# Patient Record
Sex: Female | Born: 2000 | Race: White | Hispanic: No | Marital: Single | Smoking: Never smoker
Health system: Southern US, Community
[De-identification: ages and names within clinical notes are randomized; demographics above are authoritative.]

## PROBLEM LIST (undated history)

## (undated) DIAGNOSIS — F32A Depression, unspecified: Secondary | ICD-10-CM

## (undated) DIAGNOSIS — F419 Anxiety disorder, unspecified: Secondary | ICD-10-CM

## (undated) DIAGNOSIS — B079 Viral wart, unspecified: Secondary | ICD-10-CM

## (undated) DIAGNOSIS — J45909 Unspecified asthma, uncomplicated: Secondary | ICD-10-CM

## (undated) HISTORY — DX: Unspecified asthma, uncomplicated: J45.909

## (undated) HISTORY — DX: Viral wart, unspecified: B07.9

## (undated) HISTORY — DX: Anxiety disorder, unspecified: F41.9

## (undated) HISTORY — DX: Depression, unspecified: F32.A

---
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
## 2022-03-11 ENCOUNTER — Other Ambulatory Visit: Payer: Self-pay | Admitting: Family Medicine

## 2022-03-11 DIAGNOSIS — Z3009 Encounter for other general counseling and advice on contraception: Secondary | ICD-10-CM

## 2022-05-21 ENCOUNTER — Encounter (HOSPITAL_BASED_OUTPATIENT_CLINIC_OR_DEPARTMENT_OTHER): Payer: Commercial Managed Care - PPO | Admitting: Nurse Practitioner

## 2022-06-05 ENCOUNTER — Encounter (HOSPITAL_BASED_OUTPATIENT_CLINIC_OR_DEPARTMENT_OTHER): Payer: Commercial Managed Care - PPO | Admitting: Nurse Practitioner

## 2022-06-15 ENCOUNTER — Other Ambulatory Visit (HOSPITAL_BASED_OUTPATIENT_CLINIC_OR_DEPARTMENT_OTHER): Payer: Self-pay | Admitting: Nurse Practitioner

## 2022-06-15 DIAGNOSIS — F418 Other specified anxiety disorders: Secondary | ICD-10-CM

## 2022-07-07 ENCOUNTER — Ambulatory Visit (INDEPENDENT_AMBULATORY_CARE_PROVIDER_SITE_OTHER): Payer: Commercial Managed Care - PPO | Admitting: Nurse Practitioner

## 2022-07-07 ENCOUNTER — Encounter: Payer: Self-pay | Admitting: Nurse Practitioner

## 2022-07-07 VITALS — BP 120/80 | HR 74 | Temp 98.3°F | Ht 66.0 in | Wt 170.0 lb

## 2022-07-07 DIAGNOSIS — F418 Other specified anxiety disorders: Secondary | ICD-10-CM | POA: Diagnosis not present

## 2022-07-07 DIAGNOSIS — Z0000 Encounter for general adult medical examination without abnormal findings: Secondary | ICD-10-CM | POA: Diagnosis not present

## 2022-07-07 MED ORDER — BUPROPION HCL ER (SR) 150 MG PO TB12: 150.0000 mg | ORAL_TABLET | Freq: Two times a day (BID) | ORAL | 3 refills | Status: DC

## 2022-07-07 NOTE — Patient Instructions (Signed)
I am SO PROUD OF YOU!!!

## 2022-07-08 LAB — COMPREHENSIVE METABOLIC PANEL
ALT: 11 IU/L (ref 0–32)
AST: 14 IU/L (ref 0–40)
Albumin/Globulin Ratio: 1.6 (ref 1.2–2.2)
Albumin: 4.5 g/dL (ref 4.0–5.0)
Alkaline Phosphatase: 53 IU/L (ref 42–106)
BUN/Creatinine Ratio: 9 (ref 9–23)
BUN: 8 mg/dL (ref 6–20)
Bilirubin Total: <0.2 mg/dL (ref 0.0–1.2)
CO2: 19 mmol/L — ABNORMAL LOW (ref 20–29)
Calcium: 9.4 mg/dL (ref 8.7–10.2)
Chloride: 104 mmol/L (ref 96–106)
Creatinine, Ser: 0.87 mg/dL (ref 0.57–1.00)
Globulin, Total: 2.8 g/dL (ref 1.5–4.5)
Glucose: 88 mg/dL (ref 70–99)
Potassium: 4.3 mmol/L (ref 3.5–5.2)
Sodium: 139 mmol/L (ref 134–144)
Total Protein: 7.3 g/dL (ref 6.0–8.5)
eGFR: 98 mL/min/{1.73_m2} (ref >59)

## 2022-07-08 LAB — CBC WITH DIFFERENTIAL/PLATELET
Basophils Absolute: 0 10*3/uL (ref 0.0–0.2)
Basos: 0 %
EOS (ABSOLUTE): 0 10*3/uL (ref 0.0–0.4)
Eos: 1 %
Hematocrit: 38.6 % (ref 34.0–46.6)
Hemoglobin: 13 g/dL (ref 11.1–15.9)
Immature Grans (Abs): 0 10*3/uL (ref 0.0–0.1)
Immature Granulocytes: 0 %
Lymphocytes Absolute: 2.2 10*3/uL (ref 0.7–3.1)
Lymphs: 40 %
MCH: 31.1 pg (ref 26.6–33.0)
MCHC: 33.7 g/dL (ref 31.5–35.7)
MCV: 92 fL (ref 79–97)
Monocytes Absolute: 0.5 10*3/uL (ref 0.1–0.9)
Monocytes: 9 %
Neutrophils Absolute: 2.8 10*3/uL (ref 1.4–7.0)
Neutrophils: 50 %
Platelets: 272 10*3/uL (ref 150–450)
RBC: 4.18 x10E6/uL (ref 3.77–5.28)
RDW: 12.4 % (ref 11.7–15.4)
WBC: 5.5 10*3/uL (ref 3.4–10.8)

## 2022-07-08 LAB — LIPID PANEL
Chol/HDL Ratio: 2 ratio (ref 0.0–4.4)
Cholesterol, Total: 182 mg/dL — ABNORMAL HIGH (ref 100–169)
HDL: 90 mg/dL (ref >39)
LDL Chol Calc (NIH): 80 mg/dL (ref 0–109)
Triglycerides: 61 mg/dL (ref 0–89)
VLDL Cholesterol Cal: 12 mg/dL (ref 5–40)

## 2022-07-08 LAB — TSH: TSH: 1.08 u[IU]/mL (ref 0.450–4.500)

## 2022-07-08 LAB — VITAMIN D 25 HYDROXY (VIT D DEFICIENCY, FRACTURES): Vit D, 25-Hydroxy: 28.8 ng/mL — ABNORMAL LOW (ref 30.0–100.0)

## 2022-07-08 NOTE — Assessment & Plan Note (Signed)
Chronic. Stable. Doing well on wellbutrin. Refills provided. No alarm sx present.

## 2022-07-08 NOTE — Assessment & Plan Note (Signed)

## 2022-07-08 NOTE — Progress Notes (Signed)
Worthy Keeler, DNP, AGNP-c [place], [place] [address] Main Office [phone]  BP 120/80   Pulse 74   Temp 98.3 F (36.8 C)   Ht _0  (1.676 m)   Wt 170 lb (77.1 kg)   LMP 06/09/2022   BMI 27.44 kg/m    Subjective:    Patient ID: Melanie Medina, female    DOB: [birth year], 21 y.o.   MRN: [id number]  HPI: Melanie Medina is a 21 y.o. female presenting on 07/07/2022 for comprehensive medical examination.   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 She finished her first semester with a 4.0 GPA and is enjoying school.   She reports regular vision exams q1-5y: Yes  She reports regular dental exams q 73m  Yes  She is eating a fairly healthy diet with mostly cafeteria foods. She endorses exercise and/or activity of:  Daily exercise with cycling classes and walking  She endorses the following: Marital Status: single Living situation: college housing Sexual: monogamous Occupation: [employer] broker She denies concerns with STI today, testing was not ordered  A comprehensive review of systems was negative.  Most Recent Depression Screen:     07/08/2022    7:48 AM 07/07/2022    3:12 PM 05/20/2021    2:27 PM 03/08/2021    2:59 PM 05/14/2020    3:22 PM  Depression screen PHQ 2/9  Decreased Interest 0 0 1 0 1  Down, Depressed, Hopeless 0 0 0 1 1  PHQ - 2 Score 0 0 _1 Altered sleeping 0  _2 Tired, decreased energy 0  _3 Change in appetite 0  _4 Feeling bad or failure about yourself  0  0  0  Trouble concentrating 0  _5 Moving slowly or fidgety/restless 0  1  0  Suicidal thoughts 0  0 0 0  PHQ-9 Score 0  _6 Difficult doing work/chores Not difficult at all  Somewhat difficult Somewhat difficult Somewhat difficult   Most Recent Anxiety Screen:     05/20/2021    2:28 PM 03/08/2021    2:59 PM 05/14/2020    3:23 PM 11/14/2019    3:45 PM  GAD 7 : Generalized Anxiety Score   Nervous, Anxious, on Edge _7 Control/stop worrying _8 Worry too much - different things _9 Trouble relaxing _10 Restless _11 Easily annoyed or irritable 1  1 0  Afraid - awful might happen 0 0 0 0  Total GAD 7 Score _12 Anxiety Difficulty Somewhat difficult Somewhat difficult Very difficult Somewhat difficult   Most Recent Fall Screen:    07/07/2022    3:11 PM 05/20/2021    2:27 PM 03/08/2021    2:59 PM 09/27/2019    3:49 PM 09/27/2019    3:48 PM  Fall Risk   Falls in the past year? 1 0  0 0  Number falls in past yr: 0 0 0    Injury with Fall? 1 0 0    Comment sprained ankle      Risk for fall due to : Other (Comment) No Fall Risks No Fall Risks    Follow up Falls evaluation completed Falls evaluation completed Falls evaluation completed Falls evaluation completed  Past medical history, surgical history, medications, allergies, family history and social history reviewed with patient today and changes made to appropriate areas of the chart.  Past Medical History:  Past Medical History:  Diagnosis Date   Anxiety 2020   Asthma 2016   Depression 2020   Wart viral    Medications:  Current Outpatient Medications on File Prior to Visit  Medication Sig   KARIVA 0.15-0.02/0.01 MG (21/5) tablet TAKE 1 TABLET BY MOUTH EVERY DAY   Pediatric Multivit-Minerals-C (MULTIVITAMINS PEDIATRIC PO) Take by mouth. Olly teen multivitamin gummy   No current facility-administered medications on file prior to visit.   Surgical History:  No past surgical history on file. Allergies:  No Known Allergies Family History:  Family History  Problem Relation Age of Onset   Multiple sclerosis Mother    Anxiety disorder Mother    Arthritis Mother    Depression Mother    Diabetes Father    Heart disease Father    Hypertension Father    Obesity Father    ADD / ADHD Brother        Objective:    BP 120/80   Pulse 74   Temp 98.3 F (36.8 C)   Ht _0  (1.676  m)   Wt 170 lb (77.1 kg)   LMP 06/09/2022   BMI 27.44 kg/m   Wt Readings from Last 3 Encounters:  07/07/22 170 lb (77.1 kg) (92 %, Z= 1.42)*  12/03/21 169 lb (76.7 kg) (92 %, Z= 1.43)*  05/20/21 165 lb 6.4 oz (75 kg) (92 %, Z= 1.39)*   * Growth percentiles are based on CDC (Girls, 2-20 Years) data.    Physical Exam Vitals and nursing note reviewed.  Constitutional:      General: She is not in acute distress.    Appearance: Normal appearance.  HENT:     Head: Normocephalic and atraumatic.     Right Ear: Hearing, tympanic membrane, ear canal and external ear normal.     Left Ear: Hearing, tympanic membrane, ear canal and external ear normal.     Nose: Nose normal.     Right Sinus: No maxillary sinus tenderness or frontal sinus tenderness.     Left Sinus: No maxillary sinus tenderness or frontal sinus tenderness.     Mouth/Throat:     Lips: Pink.     Mouth: Mucous membranes are moist.     Pharynx: Oropharynx is clear.  Eyes:     General: Lids are normal. Vision grossly intact.     Extraocular Movements: Extraocular movements intact.     Conjunctiva/sclera: Conjunctivae normal.     Pupils: Pupils are equal, round, and reactive to light.     Funduscopic exam:    Right eye: Red reflex present.        Left eye: Red reflex present.    Visual Fields: Right eye visual fields normal and left eye visual fields normal.  Neck:     Thyroid: No thyromegaly.     Vascular: No carotid bruit.  Cardiovascular:     Rate and Rhythm: Normal rate and regular rhythm.     Chest Wall: PMI is not displaced.     Pulses: Normal pulses.          Dorsalis pedis pulses are 2+ on the right side and 2+ on the left side.       Posterior tibial pulses are 2+ on the right side and 2+ on the left side.     Heart sounds:  Normal heart sounds. No murmur heard. Pulmonary:     Effort: Pulmonary effort is normal. No respiratory distress.     Breath sounds: Normal breath sounds.  Abdominal:     General: Abdomen  is flat. Bowel sounds are normal. There is no distension.     Palpations: Abdomen is soft. There is no hepatomegaly, splenomegaly or mass.     Tenderness: There is no abdominal tenderness. There is no right CVA tenderness, left CVA tenderness, guarding or rebound.  Musculoskeletal:        General: Normal range of motion.     Cervical back: Full passive range of motion without pain, normal range of motion and neck supple. No tenderness.     Right lower leg: No edema.     Left lower leg: No edema.  Feet:     Left foot:     Toenail Condition: Left toenails are normal.  Lymphadenopathy:     Cervical: No cervical adenopathy.     Upper Body:     Right upper body: No supraclavicular adenopathy.     Left upper body: No supraclavicular adenopathy.  Skin:    General: Skin is warm and dry.     Capillary Refill: Capillary refill takes less than 2 seconds.     Nails: There is no clubbing.  Neurological:     General: No focal deficit present.     Mental Status: She is alert and oriented to person, place, and time.     GCS: GCS eye subscore is 4. GCS verbal subscore is 5. GCS motor subscore is 6.     Sensory: Sensation is intact.     Motor: Motor function is intact.     Coordination: Coordination is intact.     Gait: Gait is intact.     Deep Tendon Reflexes: Reflexes are normal and symmetric.  Psychiatric:        Attention and Perception: Attention normal.        Mood and Affect: Mood normal.        Speech: Speech normal.        Behavior: Behavior normal. Behavior is cooperative.        Thought Content: Thought content normal.        Cognition and Memory: Cognition and memory normal.        Judgment: Judgment normal.     Results for orders placed or performed in visit on 07/07/22  CBC with Differential/Platelet  Result Value Ref Range   WBC 5.5 3.4 - 10.8 x10E3/uL   RBC 4.18 3.77 - 5.28 x10E6/uL   Hemoglobin 13.0 11.1 - 15.9 g/dL   Hematocrit 38.6 34.0 - 46.6 %   MCV 92 79 - 97 fL    MCH 31.1 26.6 - 33.0 pg   MCHC 33.7 31.5 - 35.7 g/dL   RDW 12.4 11.7 - 15.4 %   Platelets 272 150 - 450 x10E3/uL   Neutrophils 50 Not Estab. %   Lymphs 40 Not Estab. %   Monocytes 9 Not Estab. %   Eos 1 Not Estab. %   Basos 0 Not Estab. %   Neutrophils Absolute 2.8 1.4 - 7.0 x10E3/uL   Lymphocytes Absolute 2.2 0.7 - 3.1 x10E3/uL   Monocytes Absolute 0.5 0.1 - 0.9 x10E3/uL   EOS (ABSOLUTE) 0.0 0.0 - 0.4 x10E3/uL   Basophils Absolute 0.0 0.0 - 0.2 x10E3/uL   Immature Granulocytes 0 Not Estab. %   Immature Grans (Abs) 0.0 0.0 - 0.1 x10E3/uL  Comprehensive metabolic panel  Result Value Ref Range   Glucose 88 70 - 99 mg/dL   BUN 8 6 - 20 mg/dL   Creatinine, Ser 0.87 0.57 - 1.00 mg/dL   eGFR 98 >59 mL/min/1.73   BUN/Creatinine Ratio 9 9 - 23   Sodium 139 134 - 144 mmol/L   Potassium 4.3 3.5 - 5.2 mmol/L   Chloride 104 96 - 106 mmol/L   CO2 19 (L) 20 - 29 mmol/L   Calcium 9.4 8.7 - 10.2 mg/dL   Total Protein 7.3 6.0 - 8.5 g/dL   Albumin 4.5 4.0 - 5.0 g/dL   Globulin, Total 2.8 1.5 - 4.5 g/dL   Albumin/Globulin Ratio 1.6 1.2 - 2.2   Bilirubin Total <0.2 0.0 - 1.2 mg/dL   Alkaline Phosphatase 53 42 - 106 IU/L   AST 14 0 - 40 IU/L   ALT 11 0 - 32 IU/L  Lipid panel  Result Value Ref Range   Cholesterol, Total 182 (H) 100 - 169 mg/dL   Triglycerides 61 0 - 89 mg/dL   HDL 90 >39 mg/dL   VLDL Cholesterol Cal 12 5 - 40 mg/dL   LDL Chol Calc (NIH) 80 0 - 109 mg/dL   Chol/HDL Ratio 2.0 0.0 - 4.4 ratio  TSH  Result Value Ref Range   TSH 1.080 0.450 - 4.500 uIU/mL  VITAMIN D 25 Hydroxy (Vit-D Deficiency, Fractures)  Result Value Ref Range   Vit D, 25-Hydroxy 28.8 (L) 30.0 - 100.0 ng/mL    IMMUNIZATIONS:   Flu: Flu vaccine completed elsewhere this season Prevnar 13: Prevnar 13 N/A for this patient Pneumovax 23: Pneumovax 23 N/A for this patient Vac Shingrix: Shingrix N/A for this patient HPV: HPV N/A for this patient Tetanus: Tetanus completed in the last 10 years  HEALTH  MAINTENANCE: Pap Smear HM Status: is not applicable for this patient Mammogram HM Status: is not applicable for this patient Colon Cancer Screening HM Status: is not applicable for this patient Bone Density HM Status: is not applicable for this patient STI Testing HM Status: was declined   Eye Exam HM Status: is up to date Urine Micro HM Status: is not applicable for this patient  Spirometry HM Status: is not applicable for this patient      Assessment & Plan:   Problem List Items Addressed This Visit     Encounter for annual physical exam - Primary    CPE today with no abnormalities noted on exam.  Labs pending. Will make changes as necessary based on results.  Review of HM activities and recommendations discussed and provided on AVS Anticipatory guidance, diet, and exercise recommendations provided.  Medications, allergies, and hx reviewed and updated as necessary.  Plan to f/u with CPE in 1 year or sooner for acute/chronic health needs as directed.        Relevant Orders   CBC with Differential/Platelet (Completed)   Comprehensive metabolic panel (Completed)   Lipid panel (Completed)   TSH (Completed)   VITAMIN D 25 Hydroxy (Vit-D Deficiency, Fractures) (Completed)   Depression with anxiety    Chronic. Stable. Doing well on wellbutrin. Refills provided. No alarm sx present.       Relevant Medications   buPROPion (WELLBUTRIN SR) 150 MG 12 hr tablet   Other Visit Diagnoses     Health care maintenance       Relevant Orders   CBC with Differential/Platelet (Completed)   Comprehensive metabolic panel (Completed)   Lipid panel (Completed)   TSH (Completed)   VITAMIN  D 25 Hydroxy (Vit-D Deficiency, Fractures) (Completed)          Follow up plan: No follow-ups on file.  NEXT PREVENTATIVE PHYSICAL DUE IN 1 YEAR.  PATIENT COUNSELING PROVIDED FOR ALL ADULT PATIENTS:  Consume a well balanced diet low in saturated fats, cholesterol, and moderation in carbohydrates.    This can be as simple as monitoring portion sizes and cutting back on sugary beverages such as soda and juice to start with.    Daily water consumption of at least 64 ounces.  Physical activity at least 180 minutes per week, if just starting out.   This can be as simple as taking the stairs instead of the elevator and walking 2-3 laps around the office  purposefully every day.   STD protection, partner selection, and regular testing if high risk.  Limited consumption of alcoholic beverages if alcohol is consumed.  For women, I recommend no more than 7 alcoholic beverages per week, spread out throughout the week.  Avoid "binge" drinking or consuming large quantities of alcohol in one setting.   Please let me know if you feel you may need help with reduction or quitting alcohol consumption.   Avoidance of nicotine, if used.  Please let me know if you feel you may need help with reduction or quitting nicotine use.   Daily mental health attention.  This can be in the form of 5 minute daily meditation, prayer, journaling, yoga, reflection, etc.   Purposeful attention to your emotions and mental state can significantly improve your overall wellbeing and Health.  Please know that I am here to help you with all of your health care goals and am happy to work with you to find a solution that works best for you.  The greatest advice I have received with any changes in life are to take it one step at a time, that even means if all you can focus on is the next 60 seconds, then do that and celebrate your victories.  With any changes in life, you will have set backs, and that is OK. The important thing to remember is, if you have a set back, it is not a failure, it is an opportunity to try again!  Health Maintenance Recommendations Screening Testing Mammogram Every 1 -2 years based on history and risk factors Starting at age 35 Pap Smear Ages 21-39 every 3 years Ages 37-65 every 5 years with HPV  testing More frequent testing may be required based on results and history Colon Cancer Screening Every 1-10 years based on test performed, risk factors, and history Starting at age 26 Bone Density Screening Every 2-10 years based on history Starting at age 33 for women Recommendations for men differ based on medication usage, history, and risk factors AAA Screening One time ultrasound Men 47-45 years old who have every smoked Lung Cancer Screening Low Dose Lung CT every 12 months Age 58-80 years with a 30 pack-year smoking history who still smoke or who have quit within the last 15 years  Screening Labs Routine  Labs: Complete Blood Count (CBC), Complete Metabolic Panel (CMP), Cholesterol (Lipid Panel) Every 6-12 months based on history and medications May be recommended more frequently based on current conditions or previous results Hemoglobin A1c Lab Every 3-12 months based on history and previous results Starting at age 26 or earlier with diagnosis of diabetes, high cholesterol, BMI >26, and/or risk factors Frequent monitoring for patients with diabetes to ensure blood sugar control Thyroid Panel (TSH w/  T3 & T4) Every 6 months based on history, symptoms, and risk factors May be repeated more often if on medication HIV One time testing for all patients 67 and older May be repeated more frequently for patients with increased risk factors or exposure Hepatitis C One time testing for all patients 19 and older May be repeated more frequently for patients with increased risk factors or exposure Gonorrhea, Chlamydia Every 12 months for all sexually active persons 13-24 years Additional monitoring may be recommended for those who are considered high risk or who have symptoms PSA Men 43-25 years old with risk factors Additional screening may be recommended from age 68-69 based on risk factors, symptoms, and history  Vaccine Recommendations Tetanus Booster All adults every 10  years Flu Vaccine All patients 6 months and older every year COVID Vaccine All patients 12 years and older Initial dosing with booster May recommend additional booster based on age and health history HPV Vaccine 2 doses all patients age 59-26 Dosing may be considered for patients over 26 Shingles Vaccine (Shingrix) 2 doses all adults 55 years and older Pneumonia (Pneumovax 23) All adults 56 years and older May recommend earlier dosing based on health history Pneumonia (Prevnar 98) All adults 31 years and older Dosed 1 year after Pneumovax 23  Additional Screening, Testing, and Vaccinations may be recommended on an individualized basis based on family history, health history, risk factors, and/or exposure.

## 2022-07-16 ENCOUNTER — Encounter (HOSPITAL_BASED_OUTPATIENT_CLINIC_OR_DEPARTMENT_OTHER): Payer: Commercial Managed Care - PPO | Admitting: Family Medicine

## 2022-07-19 ENCOUNTER — Other Ambulatory Visit: Payer: Self-pay | Admitting: Nurse Practitioner

## 2022-07-22 ENCOUNTER — Telehealth: Payer: Self-pay | Admitting: Internal Medicine

## 2022-07-22 NOTE — Telephone Encounter (Signed)
Refill request for albuterol inhaler to [facility]. Ok to refill? I do not see this on active med list

## 2022-07-22 NOTE — Telephone Encounter (Signed)
Sent.  Thank you.

## 2022-07-24 MED ORDER — ALBUTEROL SULFATE HFA 108 (90 BASE) MCG/ACT IN AERS: 2.0000 | INHALATION_SPRAY | RESPIRATORY_TRACT | 1 refills | Status: DC | PRN

## 2022-07-24 NOTE — Telephone Encounter (Signed)
Done

## 2022-07-24 NOTE — Addendum Note (Signed)
Addended by: Minette Headland A on: 07/24/2022 03:11 PM   Modules accepted: Orders

## 2022-09-16 ENCOUNTER — Other Ambulatory Visit: Payer: Self-pay | Admitting: Nurse Practitioner

## 2023-01-19 ENCOUNTER — Encounter: Payer: Self-pay | Admitting: Nurse Practitioner

## 2023-01-19 ENCOUNTER — Ambulatory Visit: Payer: Commercial Managed Care - PPO | Admitting: Nurse Practitioner

## 2023-01-19 VITALS — BP 134/62 | HR 91 | Temp 98.7°F | Resp 16 | Wt 179.4 lb

## 2023-01-19 DIAGNOSIS — R635 Abnormal weight gain: Secondary | ICD-10-CM | POA: Diagnosis not present

## 2023-01-19 DIAGNOSIS — F418 Other specified anxiety disorders: Secondary | ICD-10-CM

## 2023-01-19 DIAGNOSIS — F988 Other specified behavioral and emotional disorders with onset usually occurring in childhood and adolescence: Secondary | ICD-10-CM | POA: Diagnosis not present

## 2023-01-19 MED ORDER — METHYLPHENIDATE HCL ER (OSM) 18 MG PO TBCR: 18.0000 mg | EXTENDED_RELEASE_TABLET | Freq: Every day | ORAL | 0 refills | Status: DC

## 2023-01-19 NOTE — Progress Notes (Signed)
Shawna Clamp, DNP, AGNP-c [facility]  [address] [place], [place] [address] [phone]  ESTABLISHED PATIENT- Chronic Health and/or Follow-Up Visit  Blood pressure 134/62, pulse 91, temperature 98.7 F (37.1 C), temperature source Oral, resp. rate 16, weight 179 lb 6.4 oz (81.4 kg), SpO2 98 %.    Melanie Medina is a 22 y.o. year old female presenting today for evaluation and management of chronic conditions.   Anxiety/Focus She reports feeling unsure about the effectiveness of her current medication (Wellbutrin) as she feels her symptoms have transitioned from depression to more anxiety and inability to focus. She attempted to switch from taking it twice a day to once a day for a week, which resulted in increased fatigue. She has been feeling "exhausted" lately but feels this is partially due to her working full time and just completing two summer courses.   She reports experiencing constant anxiety, particularly when in school, and struggles with focusing on her coursework and lectures. She has had this issue in the past but has been able to manage this by working extra hard. She feels this has become increasingly difficult in college and she finds herself very overwhelmed. She feels that her inability to focus contributes to her exhaustion. She has a strong family history of ADHD but has resisted this diagnosis in the past. Her brother has had success with Concerta and she is interested in trying this to see if it helps with her symptoms.   Weight/Eating Habits She has a history of weight fluctuations and poor eating habits, including overeating. She acknowledges that her relationship with food is unhealthy and that she struggles with portion control and self-control. The patient is active, working on her feet for eight hours a day and going to the gym, but she has difficulty managing her eating habits at home. She is hopeful that with improved impulse control she will  be be able to manage her eating habits.   All ROS negative with exception of what is listed above.   PHYSICAL EXAM Physical Exam Vitals and nursing note reviewed.  Constitutional:      Appearance: Normal appearance.  HENT:     Head: Normocephalic.  Eyes:     Pupils: Pupils are equal, round, and reactive to light.  Cardiovascular:     Rate and Rhythm: Normal rate and regular rhythm.     Pulses: Normal pulses.     Heart sounds: Normal heart sounds.  Pulmonary:     Effort: Pulmonary effort is normal.     Breath sounds: Normal breath sounds.  Musculoskeletal:        General: Normal range of motion.     Cervical back: Normal range of motion.  Skin:    General: Skin is warm.  Neurological:     General: No focal deficit present.     Mental Status: She is alert and oriented to person, place, and time.  Psychiatric:        Mood and Affect: Mood normal.     PLAN Problem List Items Addressed This Visit     Depression with anxiety - Primary    Patient reports constant anxiety and difficulty focusing, especially in school settings. She has a history of being on Wellbutrin, but reports she is unsure if it is helping with anxiety or focus. ADHD questionnaire positive today. Current symptoms with strong family history make the diagnosis very likely.  Plan: - Initiate a trial of Concerta 18 mg daily attention and focus. - Consider tapering  off of Wellbutrin if you feel your symptoms are well controlled with Concerta. - Monitor response to the medication and adjust the dosage as needed. - Encouraged her to report any side effects or concerns.      Attention deficit disorder (ADD) without hyperactivity    Patient reports constant anxiety and difficulty focusing, especially in school settings. She has a history of being on Wellbutrin, but reports she is unsure if it is helping with anxiety or focus. ADHD questionnaire positive today. Current symptoms with strong family history make the  diagnosis very likely.  Plan: - Initiate a trial of Concerta 18 mg daily attention and focus. - Consider tapering off of Wellbutrin if you feel your symptoms are well controlled with Concerta. - Monitor response to the medication and adjust the dosage as needed. - Encouraged her to report any side effects or concerns.      Relevant Medications   methylphenidate 18 MG PO CR tablet   Weight gain    Patient reports difficulty with weight management, overeating, and poor eating habits. She is concerned about the impact her lack of impulse control is having on her weight. We discussed the improvement of impulse control and potential appetite-suppressing effects of Concerta. Plan: - Provide her with weight management and healthy eating information. - Encourage portion control, mindful eating, and regular physical activity. - Monitor weight changes and eating habits after initiating Concerta.       Return in about 3 months (around 04/21/2023) for virtual adhd 30.  Time: 32 minutes, >50% spent counseling, care coordination, chart review, and documentation.   Shawna Clamp, DNP, AGNP-c

## 2023-01-19 NOTE — Assessment & Plan Note (Signed)
Patient reports constant anxiety and difficulty focusing, especially in school settings. She has a history of being on Wellbutrin, but reports she is unsure if it is helping with anxiety or focus. ADHD questionnaire positive today. Current symptoms with strong family history make the diagnosis very likely.  Plan: - Initiate a trial of Concerta 18 mg daily attention and focus. - Consider tapering off of Wellbutrin if you feel your symptoms are well controlled with Concerta. - Monitor response to the medication and adjust the dosage as needed. - Encouraged her to report any side effects or concerns. 

## 2023-01-19 NOTE — Patient Instructions (Addendum)
I have sent in the concerta for you to try. If you feel like this is helpful then we can continue it, but if you feel like it is not enough or you have no difference then please let me know so we can adjust the dose.   You can start to taper off of the wellbutrin in 4 weeks or so if you would like to try that.    WEIGHT LOSS PLANNING For best management of weight, it is vital to balance intake versus output. This means the number of calories burned per day must be less than the calories you take in with food and drink.   I recommend trying to follow a diet with the following: Calories: 1200-1500 calories per day Carbohydrates: 150-180 grams of carbohydrates per day  Why: Gives your body enough "quick fuel" for cells to maintain normal function without sending them into starvation mode.  Protein: At least 90 grams of protein per day- 30 grams with each meal Why: Protein takes longer and uses more energy than carbohydrates to break down for fuel. The carbohydrates in your meals serves as quick energy sources and proteins help use some of that extra quick energy to break down to produce long term energy. This helps you not feel hungry as quickly and protein breakdown burns calories.  Water: Drink AT LEAST 64 ounces of water per day  Why: Water is essential to healthy metabolism. Water helps to fill the stomach and keep you fuller longer. Water is required for healthy digestion and filtering of waste in the body.  Fat: Limit fats in your diet- when choosing fats, choose foods with lower fats content such as lean meats (chicken, fish, [place]).  Why: Increased fat intake leads to storage "for later". Once you burn your carbohydrate energy, your body goes into fat and protein breakdown mode to help you loose weight.  Cholesterol: Fats and oils that are LIQUID at room temperature are best. Choose vegetable oils (olive oil, avocado oil, nuts). Avoid fats that are SOLID at room temperature (animal fats,  processed meats). Healthy fats are often found in whole grains, beans, nuts, seeds, and berries.  Why: Elevated cholesterol levels lead to build up of cholesterol on the inside of your blood vessels. This will eventually cause the blood vessels to become hard and can lead to high blood pressure and damage to your organs. When the blood flow is reduced, but the pressure is high from cholesterol buildup, parts of the cholesterol can break off and form clots that can go to the brain or heart leading to a stroke or heart attack.  Fiber: Increase amount of SOLUBLE the fiber in your diet. This helps to fill you up, lowers cholesterol, and helps with digestion. Some foods high in soluble fiber are oats, peas, beans, apples, carrots, barley, and citrus fruits.   Why: Fiber fills you up, helps remove excess cholesterol, and aids in healthy digestion which are all very important in weight management.   I recommend the following as a minimum activity routine: Purposeful walk or other physical activity at least 20 minutes every single day. This means purposefully taking a walk, jog, bike, swim, treadmill, elliptical, dance, etc.  This activity should be ABOVE your normal daily activities, such as walking at work. Goal exercise should be at least 150 minutes a week- work your way up to this.   Heart Rate: Your maximum exercise heart rate should be 220 - Your Age in Years. When exercising, get your  heart rate up, but avoid going over the maximum targeted heart rate.  60-70% of your maximum heart rate is where you tend to burn the most fat. To find this number:  220 - Age In Years= Max HR  Max HR x 0.6 (or 0.7) = Fat Burning HR The Fat Burning HR is your goal heart rate while working out to burn the most fat.  NEVER exercise to the point your feel lightheaded, weak, nauseated, dizzy. If you experience ANY of these symptoms- STOP exercise! Allow yourself to cool down and your heart rate to come down. Then restart  slower next time.  If at ANY TIME you feel chest pain or chest pressure during exercise, STOP IMMEDIATELY and seek medical attention.

## 2023-01-19 NOTE — Assessment & Plan Note (Signed)
Patient reports constant anxiety and difficulty focusing, especially in school settings. She has a history of being on Wellbutrin, but reports she is unsure if it is helping with anxiety or focus. ADHD questionnaire positive today. Current symptoms with strong family history make the diagnosis very likely.  Plan: - Initiate a trial of Concerta 18 mg daily attention and focus. - Consider tapering off of Wellbutrin if you feel your symptoms are well controlled with Concerta. - Monitor response to the medication and adjust the dosage as needed. - Encouraged her to report any side effects or concerns.

## 2023-01-19 NOTE — Assessment & Plan Note (Signed)
Patient reports difficulty with weight management, overeating, and poor eating habits. She is concerned about the impact her lack of impulse control is having on her weight. We discussed the improvement of impulse control and potential appetite-suppressing effects of Concerta. Plan: - Provide her with weight management and healthy eating information. - Encourage portion control, mindful eating, and regular physical activity. - Monitor weight changes and eating habits after initiating Concerta.

## 2023-02-01 ENCOUNTER — Other Ambulatory Visit: Payer: Self-pay | Admitting: Nurse Practitioner

## 2023-02-01 DIAGNOSIS — Z3009 Encounter for other general counseling and advice on contraception: Secondary | ICD-10-CM

## 2023-02-09 ENCOUNTER — Other Ambulatory Visit: Payer: Self-pay | Admitting: Nurse Practitioner

## 2023-02-09 DIAGNOSIS — F988 Other specified behavioral and emotional disorders with onset usually occurring in childhood and adolescence: Secondary | ICD-10-CM

## 2023-02-10 ENCOUNTER — Other Ambulatory Visit: Payer: Self-pay | Admitting: Medical

## 2023-02-10 MED ORDER — METHYLPHENIDATE HCL ER (OSM) 27 MG PO TBCR: 27.0000 mg | EXTENDED_RELEASE_TABLET | Freq: Every day | ORAL | 0 refills | Status: DC

## 2023-02-10 NOTE — Telephone Encounter (Signed)
Refill request last apt 01/19/23

## 2023-03-11 ENCOUNTER — Encounter: Payer: Self-pay | Admitting: Nurse Practitioner

## 2023-03-11 DIAGNOSIS — F988 Other specified behavioral and emotional disorders with onset usually occurring in childhood and adolescence: Secondary | ICD-10-CM

## 2023-03-12 MED ORDER — METHYLPHENIDATE HCL ER (OSM) 27 MG PO TBCR: 27.0000 mg | EXTENDED_RELEASE_TABLET | Freq: Every day | ORAL | 0 refills | Status: DC

## 2023-04-24 ENCOUNTER — Encounter: Payer: Self-pay | Admitting: Nurse Practitioner

## 2023-04-24 ENCOUNTER — Ambulatory Visit: Payer: Commercial Managed Care - PPO | Admitting: Nurse Practitioner

## 2023-04-24 VITALS — BP 122/78 | HR 84 | Wt 175.2 lb

## 2023-04-24 DIAGNOSIS — F418 Other specified anxiety disorders: Secondary | ICD-10-CM

## 2023-04-24 DIAGNOSIS — F988 Other specified behavioral and emotional disorders with onset usually occurring in childhood and adolescence: Secondary | ICD-10-CM | POA: Diagnosis not present

## 2023-05-01 ENCOUNTER — Other Ambulatory Visit: Payer: Self-pay | Admitting: Nurse Practitioner

## 2023-05-01 ENCOUNTER — Encounter: Payer: Self-pay | Admitting: Nurse Practitioner

## 2023-05-01 DIAGNOSIS — F988 Other specified behavioral and emotional disorders with onset usually occurring in childhood and adolescence: Secondary | ICD-10-CM

## 2023-05-01 MED ORDER — LISDEXAMFETAMINE DIMESYLATE 30 MG PO CHEW: 30.0000 mg | CHEWABLE_TABLET | Freq: Every day | ORAL | 0 refills | Status: DC
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
## 2023-05-05 NOTE — Assessment & Plan Note (Signed)
Suboptimal response to Concerta 27mg . No reported side effects. Discussed the possibility of increasing the dose or changing to a different medication. -Change to Vyvanse. -Check-in in one month to assess response to new medication.
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
## 2023-06-22 ENCOUNTER — Other Ambulatory Visit: Payer: Self-pay | Admitting: Nurse Practitioner

## 2023-06-22 DIAGNOSIS — F988 Other specified behavioral and emotional disorders with onset usually occurring in childhood and adolescence: Secondary | ICD-10-CM

## 2023-06-22 MED ORDER — LISDEXAMFETAMINE DIMESYLATE 30 MG PO CHEW: 30.0000 mg | CHEWABLE_TABLET | Freq: Every day | ORAL | 0 refills | Status: DC

## 2023-07-09 ENCOUNTER — Other Ambulatory Visit: Payer: Self-pay | Admitting: Nurse Practitioner

## 2023-07-09 ENCOUNTER — Encounter: Payer: Self-pay | Admitting: Nurse Practitioner

## 2023-07-09 ENCOUNTER — Ambulatory Visit: Payer: Commercial Managed Care - PPO | Admitting: Nurse Practitioner

## 2023-07-09 VITALS — BP 118/72 | HR 87 | Ht 66.0 in | Wt 170.6 lb

## 2023-07-09 DIAGNOSIS — J45990 Exercise induced bronchospasm: Secondary | ICD-10-CM

## 2023-07-09 DIAGNOSIS — F418 Other specified anxiety disorders: Secondary | ICD-10-CM

## 2023-07-09 DIAGNOSIS — E559 Vitamin D deficiency, unspecified: Secondary | ICD-10-CM

## 2023-07-09 DIAGNOSIS — E785 Hyperlipidemia, unspecified: Secondary | ICD-10-CM

## 2023-07-09 DIAGNOSIS — Z3009 Encounter for other general counseling and advice on contraception: Secondary | ICD-10-CM

## 2023-07-09 DIAGNOSIS — Z0000 Encounter for general adult medical examination without abnormal findings: Secondary | ICD-10-CM

## 2023-07-09 DIAGNOSIS — F988 Other specified behavioral and emotional disorders with onset usually occurring in childhood and adolescence: Secondary | ICD-10-CM

## 2023-07-09 MED ORDER — LISDEXAMFETAMINE DIMESYLATE 30 MG PO CHEW: 30.0000 mg | CHEWABLE_TABLET | Freq: Every day | ORAL | 0 refills | Status: DC

## 2023-07-09 MED ORDER — ALPRAZOLAM 0.25 MG PO TABS: ORAL_TABLET | ORAL | 2 refills | Status: AC

## 2023-07-09 MED ORDER — DESOGESTREL-ETHINYL ESTRADIOL 0.15-0.02/0.01 MG (21/5) PO TABS: 1.0000 | ORAL_TABLET | Freq: Every day | ORAL | 3 refills | Status: DC

## 2023-07-09 MED ORDER — ALBUTEROL SULFATE HFA 108 (90 BASE) MCG/ACT IN AERS: 2.0000 | INHALATION_SPRAY | RESPIRATORY_TRACT | 3 refills | Status: DC | PRN

## 2023-07-09 NOTE — Assessment & Plan Note (Signed)

## 2023-07-09 NOTE — Assessment & Plan Note (Signed)
History of eczema.  [place] going to and from classes.  Will send in refill on albuterol inhaler.  No alarm symptoms are present at this time.  If symptoms are not well-controlled with this we can consider addition of montelukast.

## 2023-07-09 NOTE — Patient Instructions (Signed)
Wellbutrin taper: Drop down to one tablet a day for two weeks  THEN 1 tablet every other day for one week then stop

## 2023-07-09 NOTE — Assessment & Plan Note (Signed)
Depression symptoms have primarily resolved at this time however anxiety has been exacerbated.  We discussed ways to help manage this and reduce this.  We will try to taper off of her Wellbutrin at this time.  As needed Xanax 0.25 1/2-1 tab PRN for panic symptoms has been sent to the pharmacy for her to try.  Will monitor closely.

## 2023-07-09 NOTE — Progress Notes (Signed)
Shawna Clamp, DNP, AGNP-c [facility] [address] [place], [place] [address] Main Office [phone]  BP 118/72   Pulse 87   Ht 5\' 6"  (1.676 m)   Wt 170 lb 9.6 oz (77.4 kg)   LMP 06/25/2023   BMI 27.54 kg/m    Subjective:    Patient ID: Melanie Medina, female    DOB: [birth year], 22 y.o.   MRN: [id number]  HPI: SEYLAH Medina is a 22 y.o. female presenting on 07/09/2023 for comprehensive medical examination.    All tells me today that overall she is doing very well she finished her semester with an a and chemistry which was a huge source of anxiety for her earlier this semester.    She feels that her ADD is fairly well-controlled on 30 mg of Vyvanse.  She is only taking this as needed when she has classes and is avoiding this on the weekends or days off.  She tells me that it is possible that the medication could be increasing her anxiety a small amount.  She has noticed a shift in her anxiety from generalized worried to feeling more apprehensive large crowds which is a change from her previous anxiety.  She reports feeling anxious, short of breath and panicky at times when going out or in large breaths.  She is unsure if this is related directly to the Vyvanse or just general changes and her mood. Right now she tells me that her depression symptoms have pretty much resolved and her primary concern is the anxiety.  She is asking about tapering off of the Wellbutrin to see if this helps with some of the anxiety symptoms.   She feels that her anxiety is intermittently exacerbated, particularly [place] walking.  She has misplaced her albuterol inhaler so she is not sure if this is helpful.  She continues to take her birth control and her periods are well-controlled with this she has not had any missed periods.  Pertinent items are noted in HPI.  IMMUNIZATIONS:   Flu Vaccine: Flu vaccine declined, patient will complete later Prevnar 13: Prevnar  13 N/A for this patient Prevnar 20: Prevnar 20 N/A for this patient Pneumovax 23: Pneumovax 23 N/A for this patient Vac Shingrix: Shingrix N/A for this patient HPV: HPV completed, documentation in chart  Tetanus: Tetanus completed in the last 10 years COVID: COVID completed, documentation in chart  RSV: No  HEALTH MAINTENANCE: Pap Smear HM Status: N/A Mammogram HM Status: N/A Colon Cancer Screening HM Status: N/A Bone Density HM Status: N/A STI Testing HM Status: was declined  Lung CT HM Status: N/A  Concerns with vision, hearing, or dentition: No  Most Recent Depression Screen:     07/09/2023   10:53 AM 01/19/2023   11:06 AM 07/08/2022    7:48 AM 07/07/2022    3:12 PM 05/20/2021    2:27 PM  Depression screen PHQ 2/9  Decreased Interest 0 0 0 0 1  Down, Depressed, Hopeless 0 0 0 0 0  PHQ - 2 Score 0 0 0 0 1  Altered sleeping  3 0  1  Tired, decreased energy  3 0  2  Change in appetite  2 0  1  Feeling bad or failure about yourself   0 0  0  Trouble concentrating  3 0  3  Moving slowly or fidgety/restless  1 0  1  Suicidal thoughts  0 0  0  PHQ-9 Score  12  0  9  Difficult doing work/chores  Not difficult at all Not difficult at all  Somewhat difficult   Most Recent Anxiety Screen:     05/20/2021    2:28 PM 03/08/2021    2:59 PM 05/14/2020    3:23 PM 11/14/2019    3:45 PM  GAD 7 : Generalized Anxiety Score  Nervous, Anxious, on Edge 1 2 3 2   Control/stop worrying 1 1 3 1   Worry too much - different things 1 1 3 1   Trouble relaxing 1 2 3 1   Restless 1 2 1 1   Easily annoyed or irritable 1  1 0  Afraid - awful might happen 0 0 0 0  Total GAD 7 Score 6  14 6   Anxiety Difficulty Somewhat difficult Somewhat difficult Very difficult Somewhat difficult   Most Recent Fall Screen:    07/09/2023   10:53 AM 01/19/2023   11:06 AM 07/07/2022    3:11 PM 05/20/2021    2:27 PM 03/08/2021    2:59 PM  Fall Risk   Falls in the past year? 0 0 1 0   Number falls in past yr: 0 0  0 0 0  Injury with Fall? 0 0 1 0 0  Comment   sprained ankle    Risk for fall due to : No Fall Risks No Fall Risks Other (Comment) No Fall Risks No Fall Risks  Follow up Falls evaluation completed Falls evaluation completed Falls evaluation completed Falls evaluation completed Falls evaluation completed    Past medical history, surgical history, medications, allergies, family history and social history reviewed with patient today and changes made to appropriate areas of the chart.  Past Medical History:  Past Medical History:  Diagnosis Date   Anxiety 2020   Asthma 2016   Depression 2020   Encounter for annual physical exam 03/21/2019   Wart viral    Medications:  Current Outpatient Medications on File Prior to Visit  Medication Sig   Pediatric Multivit-Minerals-C (MULTIVITAMINS PEDIATRIC PO) Take by mouth. Olly teen multivitamin gummy   No current facility-administered medications on file prior to visit.   Surgical History:  No past surgical history on file. Allergies:  No Known Allergies Family History:  Family History  Problem Relation Age of Onset   Multiple sclerosis Mother    Anxiety disorder Mother    Arthritis Mother    Depression Mother    Diabetes Father    Heart disease Father    Hypertension Father    Obesity Father    ADD / ADHD Brother        Objective:    BP 118/72   Pulse 87   Ht 5\' 6"  (1.676 m)   Wt 170 lb 9.6 oz (77.4 kg)   LMP 06/25/2023   BMI 27.54 kg/m   Wt Readings from Last 3 Encounters:  07/09/23 170 lb 9.6 oz (77.4 kg)  04/24/23 175 lb 3.2 oz (79.5 kg)  01/19/23 179 lb 6.4 oz (81.4 kg) (94%, Z= 1.58)*   * Growth percentiles are based on CDC (Girls, 2-20 Years) data.    Physical Exam Vitals and nursing note reviewed.  Constitutional:      General: She is not in acute distress.    Appearance: Normal appearance.  HENT:     Head: Normocephalic and atraumatic.     Right Ear: Hearing, tympanic membrane, ear canal and external ear  normal.     Left Ear: Hearing, tympanic membrane, ear canal and external ear normal.  Nose: Nose normal.     Right Sinus: No maxillary sinus tenderness or frontal sinus tenderness.     Left Sinus: No maxillary sinus tenderness or frontal sinus tenderness.     Mouth/Throat:     Lips: Pink.     Mouth: Mucous membranes are moist.     Pharynx: Oropharynx is clear.  Eyes:     General: Lids are normal. Vision grossly intact.     Extraocular Movements: Extraocular movements intact.     Conjunctiva/sclera: Conjunctivae normal.     Pupils: Pupils are equal, round, and reactive to light.     Funduscopic exam:    Right eye: Red reflex present.        Left eye: Red reflex present.    Visual Fields: Right eye visual fields normal and left eye visual fields normal.  Neck:     Thyroid: No thyromegaly.     Vascular: No carotid bruit.  Cardiovascular:     Rate and Rhythm: Normal rate and regular rhythm.     Chest Wall: PMI is not displaced.     Pulses: Normal pulses.          Dorsalis pedis pulses are 2+ on the right side and 2+ on the left side.       Posterior tibial pulses are 2+ on the right side and 2+ on the left side.     Heart sounds: Normal heart sounds. No murmur heard. Pulmonary:     Effort: Pulmonary effort is normal. No respiratory distress.     Breath sounds: Normal breath sounds.  Abdominal:     General: Abdomen is flat. Bowel sounds are normal. There is no distension.     Palpations: Abdomen is soft. There is no hepatomegaly, splenomegaly or mass.     Tenderness: There is no abdominal tenderness. There is no right CVA tenderness, left CVA tenderness, guarding or rebound.  Musculoskeletal:        General: Normal range of motion.     Cervical back: Full passive range of motion without pain, normal range of motion and neck supple. No tenderness.     Right lower leg: No edema.     Left lower leg: No edema.  Feet:     Left foot:     Toenail Condition: Left toenails are normal.   Lymphadenopathy:     Cervical: No cervical adenopathy.     Upper Body:     Right upper body: No supraclavicular adenopathy.     Left upper body: No supraclavicular adenopathy.  Skin:    General: Skin is warm and dry.     Capillary Refill: Capillary refill takes less than 2 seconds.     Nails: There is no clubbing.  Neurological:     General: No focal deficit present.     Mental Status: She is alert and oriented to person, place, and time.     GCS: GCS eye subscore is 4. GCS verbal subscore is 5. GCS motor subscore is 6.     Sensory: Sensation is intact.     Motor: Motor function is intact.     Coordination: Coordination is intact.     Gait: Gait is intact.     Deep Tendon Reflexes: Reflexes are normal and symmetric.  Psychiatric:        Attention and Perception: Attention normal.        Mood and Affect: Mood normal.        Speech: Speech normal.  Behavior: Behavior normal. Behavior is cooperative.        Thought Content: Thought content normal.        Cognition and Memory: Cognition and memory normal.        Judgment: Judgment normal.      Results for orders placed or performed in visit on 07/07/22  CBC with Differential/Platelet   Collection Time: 07/07/22  4:33 PM  Result Value Ref Range   WBC 5.5 3.4 - 10.8 x10E3/uL   RBC 4.18 3.77 - 5.28 x10E6/uL   Hemoglobin 13.0 11.1 - 15.9 g/dL   Hematocrit 16.1 09.6 - 46.6 %   MCV 92 79 - 97 fL   MCH 31.1 26.6 - 33.0 pg   MCHC 33.7 31.5 - 35.7 g/dL   RDW 04.5 40.9 - 81.1 %   Platelets 272 150 - 450 x10E3/uL   Neutrophils 50 Not Estab. %   Lymphs 40 Not Estab. %   Monocytes 9 Not Estab. %   Eos 1 Not Estab. %   Basos 0 Not Estab. %   Neutrophils Absolute 2.8 1.4 - 7.0 x10E3/uL   Lymphocytes Absolute 2.2 0.7 - 3.1 x10E3/uL   Monocytes Absolute 0.5 0.1 - 0.9 x10E3/uL   EOS (ABSOLUTE) 0.0 0.0 - 0.4 x10E3/uL   Basophils Absolute 0.0 0.0 - 0.2 x10E3/uL   Immature Granulocytes 0 Not Estab. %   Immature Grans (Abs) 0.0  0.0 - 0.1 x10E3/uL  Comprehensive metabolic panel   Collection Time: 07/07/22  4:33 PM  Result Value Ref Range   Glucose 88 70 - 99 mg/dL   BUN 8 6 - 20 mg/dL   Creatinine, Ser 9.14 0.57 - 1.00 mg/dL   eGFR 98 >78 GN/FAO/1.30   BUN/Creatinine Ratio 9 9 - 23   Sodium 139 134 - 144 mmol/L   Potassium 4.3 3.5 - 5.2 mmol/L   Chloride 104 96 - 106 mmol/L   CO2 19 (L) 20 - 29 mmol/L   Calcium 9.4 8.7 - 10.2 mg/dL   Total Protein 7.3 6.0 - 8.5 g/dL   Albumin 4.5 4.0 - 5.0 g/dL   Globulin, Total 2.8 1.5 - 4.5 g/dL   Albumin/Globulin Ratio 1.6 1.2 - 2.2   Bilirubin Total <0.2 0.0 - 1.2 mg/dL   Alkaline Phosphatase 53 42 - 106 IU/L   AST 14 0 - 40 IU/L   ALT 11 0 - 32 IU/L  Lipid panel   Collection Time: 07/07/22  4:33 PM  Result Value Ref Range   Cholesterol, Total 182 (H) 100 - 169 mg/dL   Triglycerides 61 0 - 89 mg/dL   HDL 90 >86 mg/dL   VLDL Cholesterol Cal 12 5 - 40 mg/dL   LDL Chol Calc (NIH) 80 0 - 109 mg/dL   Chol/HDL Ratio 2.0 0.0 - 4.4 ratio  TSH   Collection Time: 07/07/22  4:33 PM  Result Value Ref Range   TSH 1.080 0.450 - 4.500 uIU/mL  VITAMIN D 25 Hydroxy (Vit-D Deficiency, Fractures)   Collection Time: 07/07/22  4:33 PM  Result Value Ref Range   Vit D, 25-Hydroxy 28.8 (L) 30.0 - 100.0 ng/mL       Assessment & Plan:   Problem List Items Addressed This Visit     Exertional asthma   History of eczema.  [place] walking frequently up and down the mountains going to and from classes.  Will send in refill on albuterol inhaler.  No alarm symptoms are present at this time.  If symptoms are not well-controlled with this we can consider addition of montelukast.      Relevant Medications   albuterol (VENTOLIN HFA) 108 (90 Base) MCG/ACT inhaler   Encounter for annual physical exam - Primary   CPE completed today. Review of HM activities and recommendations discussed and provided on AVS. Anticipatory guidance, diet, and exercise  recommendations provided. Medications, allergies, and hx reviewed and updated as necessary. Orders placed as listed below.  Plan: - Labs ordered. Will make changes as necessary based on results.  - I will review these results and send recommendations via MyChart or a telephone call.  - F/U with CPE in 1 year or sooner for acute/chronic health needs as directed.        Relevant Medications   desogestrel-ethinyl estradiol (KARIVA) 0.15-0.02/0.01 MG (21/5) tablet   Other Relevant Orders   CBC with Differential/Platelet   CMP14+EGFR   Hemoglobin A1c   Lipid panel   TSH   Depression with anxiety   Depression symptoms have primarily resolved at this time however anxiety has been exacerbated.  We discussed ways to help manage this and reduce this.  We will try to taper off of her Wellbutrin at this time.  As needed Xanax 0.25 1/2-1 tab PRN for panic symptoms has been sent to the pharmacy for her to try.  Will monitor closely.      Relevant Medications   ALPRAZolam (XANAX) 0.25 MG tablet   Attention deficit disorder (ADD) without hyperactivity   ADHD fairly well-controlled at this time on Vyvanse 30 mg once daily.  She is only taking this on days that she needs attention and focus such as classroom days.  She feels that there could be some room for improvement however with the increase in her anxiety symptoms we will hold off on making any changes to the dose at this time until we are able to determine if her symptoms are exacerbated by the ADHD medicine, Wellbutrin, or a combination.  She plans to taper off of her Wellbutrin slowly.  We discussed how to do this today. -Avoid taking ADHD medication for 1 week and monitor anxiety closely take ADHD medication for 1 week and monitor anxiety closely determine if there is a difference while on/off the medication.      Relevant Medications   lisdexamfetamine (VYVANSE) 30 MG chewable tablet (Start on 08/17/2023)   lisdexamfetamine (VYVANSE) 30 MG  chewable tablet (Start on 07/20/2023)   lisdexamfetamine (VYVANSE) 30 MG chewable tablet (Start on 09/14/2023)   Other Visit Diagnoses       Encounter for general counseling and advice on contraceptive management       Relevant Medications   desogestrel-ethinyl estradiol (KARIVA) 0.15-0.02/0.01 MG (21/5) tablet     Vitamin D deficiency       Relevant Orders   VITAMIN D 25 Hydroxy (Vit-D Deficiency, Fractures)     Elevated lipids       Relevant Orders   Lipid panel         Follow up plan: No follow-ups on file.  NEXT PREVENTATIVE PHYSICAL DUE IN 1 YEAR.  PATIENT COUNSELING PROVIDED FOR ALL ADULT PATIENTS: A well balanced diet low in saturated fats, cholesterol, and moderation in carbohydrates.  This can be as simple as monitoring portion sizes and cutting back on sugary beverages such as soda and juice to start with.    Daily water consumption of at least 64 ounces.  Physical activity at least 180 minutes per week.  If just starting  out, start 10 minutes a day and work your way up.   This can be as simple as taking the stairs instead of the elevator and walking 2-3 laps around the office  purposefully every day.   STD protection, partner selection, and regular testing if high risk.  Limited consumption of alcoholic beverages if alcohol is consumed. For men, I recommend no more than 14 alcoholic beverages per week, spread out throughout the week (max 2 per day). Avoid "binge" drinking or consuming large quantities of alcohol in one setting.  Please let me know if you feel you may need help with reduction or quitting alcohol consumption.   Avoidance of nicotine, if used. Please let me know if you feel you may need help with reduction or quitting nicotine use.   Daily mental health attention. This can be in the form of 5 minute daily meditation, prayer, journaling, yoga, reflection, etc.  Purposeful attention to your emotions and mental state can significantly improve your  overall wellbeing  and  Health.  Please know that I am here to help you with all of your health care goals and am happy to work with you to find a solution that works best for you.  The greatest advice I have received with any changes in life are to take it one step at a time, that even means if all you can focus on is the next 60 seconds, then do that and celebrate your victories.  With any changes in life, you will have set backs, and that is OK. The important thing to remember is, if you have a set back, it is not a failure, it is an opportunity to try again! Screening Testing Mammogram Every 1 -2 years based on history and risk factors Starting at age 44 Pap Smear Ages 21-39 every 3 years Ages 39-65 every 5 years with HPV testing More frequent testing may be required based on results and history Colon Cancer Screening Every 1-10 years based on test performed, risk factors, and history Starting at age 98 Bone Density Screening Every 2-10 years based on history Starting at age 15 for women Recommendations for men differ based on medication usage, history, and risk factors AAA Screening One time ultrasound Men 41-50 years old who have every smoked Lung Cancer Screening Low Dose Lung CT every 12 months Age 73-80 years with a 30 pack-year smoking history who still smoke or who have quit within the last 15 years   Screening Labs Routine  Labs: Complete Blood Count (CBC), Complete Metabolic Panel (CMP), Cholesterol (Lipid Panel) Every 6-12 months based on history and medications May be recommended more frequently based on current conditions or previous results Hemoglobin A1c Lab Every 3-12 months based on history and previous results Starting at age 38 or earlier with diagnosis of diabetes, high cholesterol, BMI >26, and/or risk factors Frequent monitoring for patients with diabetes to ensure blood sugar control Thyroid Panel (TSH) Every 6 months based on history, symptoms, and risk  factors May be repeated more often if on medication HIV One time testing for all patients 63 and older May be repeated more frequently for patients with increased risk factors or exposure Hepatitis C One time testing for all patients 98 and older May be repeated more frequently for patients with increased risk factors or exposure Gonorrhea, Chlamydia Every 12 months for all sexually active persons 13-24 years Additional monitoring may be recommended for those who are considered high risk or who have symptoms Every 12 months  for any woman on birth control, regardless of sexual activity PSA Men 34-76 years old with risk factors Additional screening may be recommended from age 76-69 based on risk factors, symptoms, and history  Vaccine Recommendations Tetanus Booster All adults every 10 years Flu Vaccine All patients 6 months and older every year COVID Vaccine All patients 12 years and older Initial dosing with booster May recommend additional booster based on age and health history HPV Vaccine 2 doses all patients age 28-26 Dosing may be considered for patients over 26 Shingles Vaccine (Shingrix) 2 doses all adults 55 years and older Pneumonia (Pneumovax 19) All adults 65 years and older May recommend earlier dosing based on health history One year apart from Prevnar 95 Pneumonia (Prevnar 46) All adults 65 years and older Dosed 1 year after Pneumovax 23 Pneumonia (Prevnar 20) One time alternative to the two dosing of 13 and 23 For all adults with initial dose of 23, 20 is recommended 1 year later For all adults with initial dose of 13, 23 is still recommended as second option 1 year later

## 2023-07-09 NOTE — Assessment & Plan Note (Signed)
ADHD fairly well-controlled at this time on Vyvanse 30 mg once daily.  She is only taking this on days that she needs attention and focus such as classroom days.  She feels that there could be some room for improvement however with the increase in her anxiety symptoms we will hold off on making any changes to the dose at this time until we are able to determine if her symptoms are exacerbated by the ADHD medicine, Wellbutrin, or a combination.  She plans to taper off of her Wellbutrin slowly.  We discussed how to do this today. -Avoid taking ADHD medication for 1 week and monitor anxiety closely take ADHD medication for 1 week and monitor anxiety closely determine if there is a difference while on/off the medication.

## 2023-07-09 NOTE — Telephone Encounter (Signed)
Alternative requested per pharmacy

## 2023-07-10 LAB — LIPID PANEL
Chol/HDL Ratio: 2.4 {ratio} (ref 0.0–4.4)
Cholesterol, Total: 202 mg/dL — ABNORMAL HIGH (ref 100–199)
HDL: 85 mg/dL (ref >39)
LDL Chol Calc (NIH): 96 mg/dL (ref 0–99)
Triglycerides: 122 mg/dL (ref 0–149)
VLDL Cholesterol Cal: 21 mg/dL (ref 5–40)

## 2023-07-10 LAB — CMP14+EGFR
ALT: 15 [IU]/L (ref 0–32)
AST: 16 [IU]/L (ref 0–40)
Albumin: 4.5 g/dL (ref 4.0–5.0)
Alkaline Phosphatase: 58 [IU]/L (ref 42–106)
BUN/Creatinine Ratio: 14 (ref 9–23)
BUN: 13 mg/dL (ref 6–20)
Bilirubin Total: 0.3 mg/dL (ref 0.0–1.2)
CO2: 19 mmol/L — ABNORMAL LOW (ref 20–29)
Calcium: 9.6 mg/dL (ref 8.7–10.2)
Chloride: 103 mmol/L (ref 96–106)
Creatinine, Ser: 0.94 mg/dL (ref 0.57–1.00)
Globulin, Total: 2.7 g/dL (ref 1.5–4.5)
Glucose: 82 mg/dL (ref 70–99)
Potassium: 4.5 mmol/L (ref 3.5–5.2)
Sodium: 138 mmol/L (ref 134–144)
Total Protein: 7.2 g/dL (ref 6.0–8.5)
eGFR: 89 mL/min/{1.73_m2} (ref >59)

## 2023-07-10 LAB — CBC WITH DIFFERENTIAL/PLATELET
Basophils Absolute: 0 10*3/uL (ref 0.0–0.2)
Basos: 0 %
EOS (ABSOLUTE): 0 10*3/uL (ref 0.0–0.4)
Eos: 0 %
Hematocrit: 40.1 % (ref 34.0–46.6)
Hemoglobin: 13.3 g/dL (ref 11.1–15.9)
Immature Grans (Abs): 0 10*3/uL (ref 0.0–0.1)
Immature Granulocytes: 0 %
Lymphocytes Absolute: 1.8 10*3/uL (ref 0.7–3.1)
Lymphs: 40 %
MCH: 30.9 pg (ref 26.6–33.0)
MCHC: 33.2 g/dL (ref 31.5–35.7)
MCV: 93 fL (ref 79–97)
Monocytes Absolute: 0.4 10*3/uL (ref 0.1–0.9)
Monocytes: 9 %
Neutrophils Absolute: 2.2 10*3/uL (ref 1.4–7.0)
Neutrophils: 51 %
Platelets: 270 10*3/uL (ref 150–450)
RBC: 4.31 x10E6/uL (ref 3.77–5.28)
RDW: 12.2 % (ref 11.7–15.4)
WBC: 4.5 10*3/uL (ref 3.4–10.8)

## 2023-07-10 LAB — HEMOGLOBIN A1C
Est. average glucose Bld gHb Est-mCnc: 108 mg/dL
Hgb A1c MFr Bld: 5.4 % (ref 4.8–5.6)

## 2023-07-10 LAB — VITAMIN D 25 HYDROXY (VIT D DEFICIENCY, FRACTURES): Vit D, 25-Hydroxy: 26.5 ng/mL — ABNORMAL LOW (ref 30.0–100.0)

## 2023-07-10 LAB — TSH: TSH: 2.28 u[IU]/mL (ref 0.450–4.500)

## 2023-07-25 ENCOUNTER — Other Ambulatory Visit: Payer: Self-pay | Admitting: Nurse Practitioner

## 2023-07-25 DIAGNOSIS — F418 Other specified anxiety disorders: Secondary | ICD-10-CM

## 2023-11-16 ENCOUNTER — Other Ambulatory Visit: Payer: Self-pay | Admitting: Nurse Practitioner

## 2023-11-16 DIAGNOSIS — F988 Other specified behavioral and emotional disorders with onset usually occurring in childhood and adolescence: Secondary | ICD-10-CM

## 2023-11-16 MED ORDER — LISDEXAMFETAMINE DIMESYLATE 30 MG PO CHEW: 30.0000 mg | CHEWABLE_TABLET | Freq: Every day | ORAL | 0 refills | Status: DC

## 2023-11-16 NOTE — Telephone Encounter (Signed)
 Last apt 07/09/23.

## 2024-02-23 ENCOUNTER — Encounter: Payer: Self-pay | Admitting: Nurse Practitioner

## 2024-02-24 ENCOUNTER — Other Ambulatory Visit: Payer: Self-pay | Admitting: Nurse Practitioner

## 2024-02-24 DIAGNOSIS — F988 Other specified behavioral and emotional disorders with onset usually occurring in childhood and adolescence: Secondary | ICD-10-CM

## 2024-02-24 MED ORDER — LISDEXAMFETAMINE DIMESYLATE 30 MG PO CHEW: 30.0000 mg | CHEWABLE_TABLET | Freq: Every day | ORAL | 0 refills | Status: DC

## 2024-04-25 ENCOUNTER — Encounter: Payer: Self-pay | Admitting: Nurse Practitioner

## 2024-04-26 ENCOUNTER — Other Ambulatory Visit: Payer: Self-pay | Admitting: Nurse Practitioner

## 2024-04-26 DIAGNOSIS — F988 Other specified behavioral and emotional disorders with onset usually occurring in childhood and adolescence: Secondary | ICD-10-CM

## 2024-04-26 MED ORDER — LISDEXAMFETAMINE DIMESYLATE 30 MG PO CHEW: 30.0000 mg | CHEWABLE_TABLET | Freq: Every day | ORAL | 0 refills | Status: DC

## 2024-04-26 NOTE — Progress Notes (Signed)
 Received fax from [facility] in [place], [place] requesting clarification of script sent for ADHD medication earlier today. Pharmacy questioning whether patient is seen in person for ADHD management given patient scripts are sent to the pharmacy where she resides for school. Repeat prescription sent.

## 2024-06-26 ENCOUNTER — Other Ambulatory Visit: Payer: Self-pay | Admitting: Nurse Practitioner

## 2024-06-26 DIAGNOSIS — Z3009 Encounter for other general counseling and advice on contraception: Secondary | ICD-10-CM

## 2024-07-11 NOTE — Assessment & Plan Note (Signed)
 CPE completed today. Review of HM activities and recommendations discussed and provided on AVS. Anticipatory guidance, diet, and exercise recommendations provided. Medications, allergies, and hx reviewed and updated as necessary. Orders placed as listed below.  Plan: - Labs ordered. Will make changes as necessary based on results.  - I will review these results and send recommendations via MyChart or a telephone call.  - Due for first pap, will place referral for GYN. Routine STI testing today per guidelines.  - F/U with CPE in 1 year or sooner for acute/chronic health needs as directed.   Orders:   Hemoglobin A1c   CBC with Differential/Platelet   Comprehensive metabolic panel with GFR   Lipid panel   VITAMIN D  25 Hydroxy (Vit-D Deficiency, Fractures)   TSH   Ambulatory referral to Obstetrics / Gynecology   Hepatitis C antibody   HIV Antibody (routine testing w rflx)   STI Profile, CT/NG/TV

## 2024-07-11 NOTE — Progress Notes (Unsigned)
 {SEHM (Optional):34217}  Melanie Doing, DNP, AGNP-c [facility] [address] [place], [place] [address] Main Office [phone] VISIT TYPE: CPE on 07/12/2024 There were no vitals filed for this visit. There is no height or weight on file to calculate BMI.  Wt Readings from Last 3 Encounters:  07/09/23 170 lb 9.6 oz (77.4 kg)  04/24/23 175 lb 3.2 oz (79.5 kg)  01/19/23 179 lb 6.4 oz (81.4 kg) (94%, Z= 1.58)*   * Growth percentiles are based on CDC (Girls, 2-20 Years) data.     Subjective:  No chief complaint on file.   *** {Ros; complete female:19594::Pertinent items are noted in HPI.}     07/09/2023   10:53 AM 01/19/2023   11:06 AM 07/08/2022    7:48 AM 07/07/2022    3:12 PM 05/20/2021    2:27 PM  Depression screen PHQ 2/9  Decreased Interest 0 0 0 0 1  Down, Depressed, Hopeless 0 0 0 0 0  PHQ - 2 Score 0 0 0 0 1  Altered sleeping  3 0  1  Tired, decreased energy  3 0  2  Change in appetite  2 0  1  Feeling bad or failure about yourself   0 0  0  Trouble concentrating  3 0  3  Moving slowly or fidgety/restless  1 0  1  Suicidal thoughts  0 0  0  PHQ-9 Score  12  0   9   Difficult [place] work/chores  Not difficult at all Not difficult at all  Somewhat difficult     Data saved with a previous flowsheet row definition       05/20/2021    2:28 PM 03/08/2021    2:59 PM 05/14/2020    3:23 PM 11/14/2019    3:45 PM  GAD 7 : Generalized Anxiety Score  Nervous, Anxious, on Edge 1 2 3 2   Control/stop worrying 1 1 3 1   Worry too much - different things 1 1 3 1   Trouble relaxing 1 2 3 1   Restless 1 2 1 1   Easily annoyed or irritable 1  1 0  Afraid - awful might happen 0 0 0 0  Total GAD 7 Score 6  14 6   Anxiety Difficulty Somewhat difficult Somewhat difficult Very difficult Somewhat difficult       07/09/2023   10:53 AM 01/19/2023   11:06 AM 07/07/2022    3:11 PM 05/20/2021    2:27 PM 03/08/2021    2:59 PM  Fall Risk   Falls in the past  year? 0 0 1 0   Number falls in past yr: 0 0 0 0 0  Injury with Fall? 0  0  1  0  0   Comment   sprained ankle     Risk for fall due to : No Fall Risks No Fall Risks Other (Comment) No Fall Risks No Fall Risks  Follow up Falls evaluation completed Falls evaluation completed Falls evaluation completed  Falls evaluation completed  Falls evaluation completed      Data saved with a previous flowsheet row definition   Past medical history, surgical history, medications, allergies, family history and social history reviewed with patient today and changes made to appropriate areas of the chart.      Objective:    Physical Exam Vitals and nursing note reviewed.  Constitutional:      General: She is not in acute distress.    Appearance: Normal appearance.  HENT:  Head: Normocephalic and atraumatic.     Right Ear: Hearing, tympanic membrane, ear canal and external ear normal.     Left Ear: Hearing, tympanic membrane, ear canal and external ear normal.     Nose: Nose normal.     Right Sinus: No maxillary sinus tenderness or frontal sinus tenderness.     Left Sinus: No maxillary sinus tenderness or frontal sinus tenderness.     Mouth/Throat:     Lips: Pink.     Mouth: Mucous membranes are moist.     Pharynx: Oropharynx is clear.  Eyes:     General: Lids are normal. Vision grossly intact.     Extraocular Movements: Extraocular movements intact.     Conjunctiva/sclera: Conjunctivae normal.     Pupils: Pupils are equal, round, and reactive to light.     Funduscopic exam:    Right eye: Red reflex present.        Left eye: Red reflex present.    Visual Fields: Right eye visual fields normal and left eye visual fields normal.  Neck:     Thyroid: No thyromegaly.     Vascular: No carotid bruit.  Cardiovascular:     Rate and Rhythm: Normal rate and regular rhythm.     Chest Wall: PMI is not displaced.     Pulses: Normal pulses.          Dorsalis pedis pulses are 2+ on the right side and  2+ on the left side.       Posterior tibial pulses are 2+ on the right side and 2+ on the left side.     Heart sounds: Normal heart sounds. No murmur heard. Pulmonary:     Effort: Pulmonary effort is normal. No respiratory distress.     Breath sounds: Normal breath sounds.  Abdominal:     General: Abdomen is flat. Bowel sounds are normal. There is no distension.     Palpations: Abdomen is soft. There is no hepatomegaly, splenomegaly or mass.     Tenderness: There is no abdominal tenderness. There is no right CVA tenderness, left CVA tenderness, guarding or rebound.  Musculoskeletal:        General: Normal range of motion.     Cervical back: Full passive range of motion without pain, normal range of motion and neck supple. No tenderness.     Right lower leg: No edema.     Left lower leg: No edema.  Feet:     Left foot:     Toenail Condition: Left toenails are normal.  Lymphadenopathy:     Cervical: No cervical adenopathy.     Upper Body:     Right upper body: No supraclavicular adenopathy.     Left upper body: No supraclavicular adenopathy.  Skin:    General: Skin is warm and dry.     Capillary Refill: Capillary refill takes less than 2 seconds.     Nails: There is no clubbing.  Neurological:     General: No focal deficit present.     Mental Status: She is alert and oriented to person, place, and time.     GCS: GCS eye subscore is 4. GCS verbal subscore is 5. GCS motor subscore is 6.     Sensory: Sensation is intact.     Motor: Motor function is intact.     Coordination: Coordination is intact.     Gait: Gait is intact.     Deep Tendon Reflexes: Reflexes are normal and symmetric.  Psychiatric:  Attention and Perception: Attention normal.        Mood and Affect: Mood normal.        Speech: Speech normal.        Behavior: Behavior normal. Behavior is cooperative.        Thought Content: Thought content normal.        Cognition and Memory: Cognition and memory normal.         Judgment: Judgment normal.         Assessment & Plan:   Assessment & Plan Encounter for annual physical exam     NEXT PREVENTATIVE PHYSICAL DUE IN 1 YEAR.    PATIENT COUNSELING PROVIDED FOR ALL ADULT PATIENTS: A well balanced diet low in saturated fats, cholesterol, and moderation in carbohydrates.  This can be as simple as monitoring portion sizes and cutting back on sugary beverages such as soda and juice to start with.    Daily water consumption of at least 64 ounces.  Physical activity at least 180 minutes per week.  If just starting out, start 10 minutes a day and work your way up.   This can be as simple as taking the stairs instead of the elevator and walking 2-3 laps around the office  purposefully every day.   STD protection, partner selection, and regular testing if high risk.  Limited consumption of alcoholic beverages if alcohol is consumed. For men, I recommend no more than 14 alcoholic beverages per week, spread out throughout the week (max 2 per day). Avoid binge drinking or consuming large quantities of alcohol in one setting.  Please let me know if you feel you may need help with reduction or quitting alcohol consumption.   Avoidance of nicotine, if used. Please let me know if you feel you may need help with reduction or quitting nicotine use.   Daily mental health attention. This can be in the form of 5 minute daily meditation, prayer, journaling, yoga, reflection, etc.  Purposeful attention to your emotions and mental state can significantly improve your overall wellbeing  and  Health.  Please know that I am here to help you with all of your health care goals and am happy to work with you to find a solution that works best for you.  The greatest advice I have received with any changes in life are to take it one step at a time, that even means if all you can focus on is the next 60 seconds, then do that and celebrate your victories.  With any  changes in life, you will have set backs, and that is OK. The important thing to remember is, if you have a set back, it is not a failure, it is an opportunity to try again! Screening Testing Mammogram Every 1 -2 years based on history and risk factors Starting at age 70 Pap Smear Ages 21-39 every 3 years Ages 33-65 every 5 years with HPV testing More frequent testing may be required based on results and history Colon Cancer Screening Every 1-10 years based on test performed, risk factors, and history Starting at age 22 Bone Density Screening Every 2-10 years based on history Starting at age 94 for women Recommendations for men differ based on medication usage, history, and risk factors AAA Screening One time ultrasound Men 20-59 years old who have every smoked Lung Cancer Screening Low Dose Lung CT every 12 months Age 38-80 years with a 30 pack-year smoking history who still smoke or who have quit within the last  15 years   Screening Labs Routine  Labs: Complete Blood Count (CBC), Complete Metabolic Panel (CMP), Cholesterol (Lipid Panel) Every 6-12 months based on history and medications May be recommended more frequently based on current conditions or previous results Hemoglobin A1c Lab Every 3-12 months based on history and previous results Starting at age 46 or earlier with diagnosis of diabetes, high cholesterol, BMI >26, and/or risk factors Frequent monitoring for patients with diabetes to ensure blood sugar control Thyroid Panel (TSH) Every 6 months based on history, symptoms, and risk factors May be repeated more often if on medication HIV One time testing for all patients 85 and older May be repeated more frequently for patients with increased risk factors or exposure Hepatitis C One time testing for all patients 63 and older May be repeated more frequently for patients with increased risk factors or exposure Gonorrhea, Chlamydia Every 12 months for all sexually  active persons 13-24 years Additional monitoring may be recommended for those who are considered high risk or who have symptoms Every 12 months for any woman on birth control, regardless of sexual activity PSA Men 62-60 years old with risk factors Additional screening may be recommended from age 59-69 based on risk factors, symptoms, and history  Vaccine Recommendations Tetanus Booster All adults every 10 years Flu Vaccine All patients 6 months and older every year COVID Vaccine All patients 12 years and older Initial dosing with booster May recommend additional booster based on age and health history HPV Vaccine 2 doses all patients age 85-26 Dosing may be considered for patients over 26 Shingles Vaccine (Shingrix) 2 doses all adults 55 years and older Pneumonia (Pneumovax 23) All adults 65 years and older May recommend earlier dosing based on health history One year apart from Prevnar 13 Pneumonia (Prevnar 34) All adults 65 years and older Dosed 1 year after Pneumovax 23 Pneumonia (Prevnar 20) One time alternative to the two dosing of 13 and 23 For all adults with initial dose of 23, 20 is recommended 1 year later For all adults with initial dose of 13, 23 is still recommended as second option 1 year later

## 2024-07-12 ENCOUNTER — Ambulatory Visit: Payer: Commercial Managed Care - PPO | Admitting: Nurse Practitioner

## 2024-07-12 ENCOUNTER — Ambulatory Visit
Admission: RE | Admit: 2024-07-12 | Discharge: 2024-07-12 | Disposition: A | Discharge status: Home / Self Care | Source: Ambulatory Visit | Attending: Nurse Practitioner | Admitting: Nurse Practitioner

## 2024-07-12 ENCOUNTER — Encounter: Payer: Self-pay | Admitting: Nurse Practitioner

## 2024-07-12 VITALS — BP 122/78 | HR 68 | Ht 65.5 in | Wt 181.2 lb

## 2024-07-12 DIAGNOSIS — Z1159 Encounter for screening for other viral diseases: Secondary | ICD-10-CM

## 2024-07-12 DIAGNOSIS — L853 Xerosis cutis: Secondary | ICD-10-CM

## 2024-07-12 DIAGNOSIS — Z113 Encounter for screening for infections with a predominantly sexual mode of transmission: Secondary | ICD-10-CM

## 2024-07-12 DIAGNOSIS — F988 Other specified behavioral and emotional disorders with onset usually occurring in childhood and adolescence: Secondary | ICD-10-CM

## 2024-07-12 DIAGNOSIS — Z114 Encounter for screening for human immunodeficiency virus [HIV]: Secondary | ICD-10-CM | POA: Diagnosis not present

## 2024-07-12 DIAGNOSIS — M25551 Pain in right hip: Secondary | ICD-10-CM

## 2024-07-12 DIAGNOSIS — M5432 Sciatica, left side: Secondary | ICD-10-CM

## 2024-07-12 DIAGNOSIS — Z0000 Encounter for general adult medical examination without abnormal findings: Secondary | ICD-10-CM | POA: Diagnosis not present

## 2024-07-12 MED ORDER — AMPHETAMINE-DEXTROAMPHETAMINE 10 MG PO TABS: ORAL_TABLET | ORAL | 0 refills | Status: AC

## 2024-07-12 MED ORDER — LISDEXAMFETAMINE DIMESYLATE 30 MG PO CHEW: 30.0000 mg | CHEWABLE_TABLET | Freq: Every day | ORAL | 0 refills | Status: AC

## 2024-07-12 NOTE — Assessment & Plan Note (Signed)
 Dry patches under the legs, likely eczema, exacerbated by dry conditions. No itching or significant discomfort reported. Patches are skin colored and flaky with no signs of infection. Recommend good moisturizer to the skin daily. Consider adding a small amount of hydrocortisone cream to the lotion twice a day for one week to help healing.  - Apply hydrocortisone cream and lotion to affected areas twice a day for one week.

## 2024-07-12 NOTE — Patient Instructions (Addendum)
 We recommend cervical cancer screening starting at age 21. This is completed every 3 years (as long as results are normal) with a pap smear. The process can be intimidating for many women, but necessary to make sure we rule out any concerns Melanie Medina. I have sent a referral to OB/GYN for you. Someone will contact you to schedule an appointment for this.   I will put an x-ray in to see if we can find a root cause of the sciatic and hip pain. You can have this done at [facility] at [address] You can walk in to have this done at your convenience. We will have you follow up with Dr. Vita (sports medicine) in our office to evaluate further.   For all adult patients, I recommend A well balanced diet low in saturated fats, cholesterol, and moderation in carbohydrates.   This can be as simple as monitoring portion sizes and cutting back on sugary beverages such as soda and juice to start with.    Daily water consumption of at least 64 ounces.  Physical activity at least 180 minutes per week, if just starting out.   This can be as simple as taking the stairs instead of the elevator and walking 2-3 laps around the office  purposefully every day.   STD protection, partner selection, and regular testing if high risk.  Limited consumption of alcoholic beverages if alcohol is consumed.  For women, I recommend no more than 7 alcoholic beverages per week, spread out throughout the week.  Avoid binge drinking or consuming large quantities of alcohol in one setting.   Please let me know if you feel you may need help with reduction or quitting alcohol consumption.   Avoidance of nicotine, if used.  Please let me know if you feel you may need help with reduction or quitting nicotine use.   Daily mental health attention.  This can be in the form of 5 minute daily meditation, prayer, journaling, yoga, reflection, etc.   Purposeful attention to your emotions and mental state can significantly  improve your overall wellbeing  and  Health.  Please know that I am here to help you with all of your health care goals and am happy to work with you to find a solution that works best for you.  The greatest advice I have received with any changes in life are to take it one step at a time, that even means if all you can focus on is the next 60 seconds, then do that and celebrate your victories.  With any changes in life, you will have set backs, and that is OK. The important thing to remember is, if you have a set back, it is not a failure, it is an opportunity to try again!  Health Maintenance Recommendations Screening Testing Mammogram Every 1 -2 years based on history and risk factors Starting at age 24 Pap Smear Ages 21-39 every 3 years Ages 100-65 every 5 years with HPV testing More frequent testing may be required based on results and history Colon Cancer Screening Every 1-10 years based on test performed, risk factors, and history Starting at age 14 Bone Density Screening Every 2-10 years based on history Starting at age 65 for women Recommendations for men differ based on medication usage, history, and risk factors AAA Screening One time ultrasound Men 98-8 years old who have every smoked Lung Cancer Screening Low Dose Lung CT every 12 months Age 10-80 years with a 30 pack-year  smoking history who still smoke or who have quit within the last 15 years  Screening Labs Routine  Labs: Complete Blood Count (CBC), Complete Metabolic Panel (CMP), Cholesterol (Lipid Panel) Every 6-12 months based on history and medications May be recommended more frequently based on current conditions or previous results Hemoglobin A1c Lab Every 3-12 months based on history and previous results Starting at age 5 or earlier with diagnosis of diabetes, high cholesterol, BMI >26, and/or risk factors Frequent monitoring for patients with diabetes to ensure blood sugar control Thyroid Panel (TSH w/  T3 & T4) Every 6 months based on history, symptoms, and risk factors May be repeated more often if on medication HIV One time testing for all patients 63 and older May be repeated more frequently for patients with increased risk factors or exposure Hepatitis C One time testing for all patients 71 and older May be repeated more frequently for patients with increased risk factors or exposure Gonorrhea, Chlamydia Every 12 months for all sexually active persons 13-24 years Additional monitoring may be recommended for those who are considered high risk or who have symptoms PSA Men 15-41 years old with risk factors Additional screening may be recommended from age 20-69 based on risk factors, symptoms, and history  Vaccine Recommendations Tetanus Booster All adults every 10 years Flu Vaccine All patients 6 months and older every year COVID Vaccine All patients 12 years and older Initial dosing with booster May recommend additional booster based on age and health history HPV Vaccine 2 doses all patients age 104-26 Dosing may be considered for patients over 26 Shingles Vaccine (Shingrix) 2 doses all adults 55 years and older Pneumonia (Pneumovax 23) All adults 65 years and older May recommend earlier dosing based on health history Pneumonia (Prevnar 78) All adults 65 years and older Dosed 1 year after Pneumovax 23  Additional Screening, Testing, and Vaccinations may be recommended on an individualized basis based on family history, health history, risk factors, and/or exposure.

## 2024-07-12 NOTE — Assessment & Plan Note (Signed)
 Current Vyvanse  regimen is insufficient, with effects lasting only 3-4 hours. Attention and focus requirements are changing due to college course load and work requirements.  Consideration of rapid metabolism as a factor. Will plan to add Adderall 10mg  in the afternoon to help with sustained attention and focus management. If dosage adjustment is needed, recommend reaching out to me via MyChart. Will plan for follow-up in 6 months.  - Prescribed Adderall 10 mg for afternoon use to extend symptom control. - Continue current Vyvanse  regimen. Orders:   lisdexamfetamine  (VYVANSE ) 30 MG chewable tablet; Chew 1 tablet (30 mg total) by mouth daily.   amphetamine -dextroamphetamine  (ADDERALL) 10 MG tablet; Take 1 tablet in the afternoon for ADHD symptoms.

## 2024-07-14 LAB — CBC WITH DIFFERENTIAL/PLATELET
Basophils Absolute: 0 x10E3/uL (ref 0.0–0.2)
Basos: 0 %
EOS (ABSOLUTE): 0.1 x10E3/uL (ref 0.0–0.4)
Eos: 1 %
Hematocrit: 39.3 % (ref 34.0–46.6)
Hemoglobin: 12.9 g/dL (ref 11.1–15.9)
Immature Grans (Abs): 0 x10E3/uL (ref 0.0–0.1)
Immature Granulocytes: 0 %
Lymphocytes Absolute: 1.7 x10E3/uL (ref 0.7–3.1)
Lymphs: 33 %
MCH: 30.9 pg (ref 26.6–33.0)
MCHC: 32.8 g/dL (ref 31.5–35.7)
MCV: 94 fL (ref 79–97)
Monocytes Absolute: 0.4 x10E3/uL (ref 0.1–0.9)
Monocytes: 7 %
Neutrophils Absolute: 3.1 x10E3/uL (ref 1.4–7.0)
Neutrophils: 59 %
Platelets: 250 x10E3/uL (ref 150–450)
RBC: 4.17 x10E6/uL (ref 3.77–5.28)
RDW: 12.6 % (ref 11.7–15.4)
WBC: 5.2 x10E3/uL (ref 3.4–10.8)

## 2024-07-14 LAB — STI PROFILE, CT/NG/TV
HCV Ab: NONREACTIVE
HIV Screen 4th Generation wRfx: NONREACTIVE
Hep B Core Total Ab: NEGATIVE
Hep B Surface Ab, Qual: NONREACTIVE
Hepatitis B Surface Ag: NEGATIVE
RPR Ser Ql: NONREACTIVE

## 2024-07-14 LAB — LIPID PANEL
Chol/HDL Ratio: 2.1 ratio (ref 0.0–4.4)
Cholesterol, Total: 197 mg/dL (ref 100–199)
HDL: 93 mg/dL
LDL Chol Calc (NIH): 89 mg/dL (ref 0–99)
Triglycerides: 84 mg/dL (ref 0–149)
VLDL Cholesterol Cal: 15 mg/dL (ref 5–40)

## 2024-07-14 LAB — HCV INTERPRETATION

## 2024-07-14 LAB — COMPREHENSIVE METABOLIC PANEL WITH GFR
ALT: 11 IU/L (ref 0–32)
AST: 16 IU/L (ref 0–40)
Albumin: 4.1 g/dL (ref 4.0–5.0)
Alkaline Phosphatase: 53 IU/L (ref 41–116)
BUN/Creatinine Ratio: 14 (ref 9–23)
BUN: 11 mg/dL (ref 6–20)
Bilirubin Total: <0.2 mg/dL (ref 0.0–1.2)
CO2: 18 mmol/L — ABNORMAL LOW (ref 20–29)
Calcium: 9.4 mg/dL (ref 8.7–10.2)
Chloride: 105 mmol/L (ref 96–106)
Creatinine, Ser: 0.81 mg/dL (ref 0.57–1.00)
Globulin, Total: 2.7 g/dL (ref 1.5–4.5)
Glucose: 80 mg/dL (ref 70–99)
Potassium: 4.6 mmol/L (ref 3.5–5.2)
Sodium: 139 mmol/L (ref 134–144)
Total Protein: 6.8 g/dL (ref 6.0–8.5)
eGFR: 106 mL/min/1.73

## 2024-07-14 LAB — HEMOGLOBIN A1C
Est. average glucose Bld gHb Est-mCnc: 103 mg/dL
Hgb A1c MFr Bld: 5.2 % (ref 4.8–5.6)

## 2024-07-14 LAB — VITAMIN D 25 HYDROXY (VIT D DEFICIENCY, FRACTURES): Vit D, 25-Hydroxy: 22.1 ng/mL — ABNORMAL LOW (ref 30.0–100.0)

## 2024-07-14 LAB — HEPATITIS C ANTIBODY

## 2024-07-14 LAB — TSH: TSH: 2.15 u[IU]/mL (ref 0.450–4.500)

## 2024-07-20 ENCOUNTER — Ambulatory Visit: Payer: Self-pay | Admitting: Nurse Practitioner

## 2024-07-25 ENCOUNTER — Ambulatory Visit: Admitting: Family Medicine

## 2024-07-25 ENCOUNTER — Encounter: Payer: Self-pay | Admitting: Family Medicine

## 2024-07-25 VITALS — BP 128/80 | HR 78 | Wt 179.4 lb

## 2024-07-25 DIAGNOSIS — M25551 Pain in right hip: Secondary | ICD-10-CM

## 2024-07-25 DIAGNOSIS — M25351 Other instability, right hip: Secondary | ICD-10-CM

## 2024-07-25 MED ORDER — MELOXICAM 15 MG PO TABS: 15.0000 mg | ORAL_TABLET | Freq: Every day | ORAL | 0 refills | Status: AC

## 2024-07-25 NOTE — Progress Notes (Signed)
 "  Name: Melanie Medina   Date of Visit: 07/25/2024   Date of last visit with me: Visit date not found   CHIEF COMPLAINT:  Chief Complaint  Patient presents with   Acute Visit    Right hip and back pain. Started about a year ago, off and on not super constant. When standing a lot will get random pain, hip x-ray looked good. No injury.        HPI:  Discussed the use of AI scribe software for clinical note transcription with the patient, who gave verbal consent to proceed.  History of Present Illness   Melanie Medina is a 24 year old female who presents with chronic right hip pain.  She has been experiencing right hip pain for approximately one year. The pain is intermittent and primarily occurs when standing in one spot for more than an hour. It is associated with a popping sensation, which she feels relieves tension. There is no history of specific injury or trauma that could have initiated the pain.  The pain is localized to the right hip and sometimes radiates to the lower back on the same side. Occasionally, she experiences pain on the opposite side, but this is less common. The hip pain is the most consistent symptom, while the back pain occurs sporadically.  She notes that the hip sometimes feels unstable, particularly after standing for extended periods, such as during a practicum where she was required to stand for hours. This instability is described as a sensation of the hip needing to 'pop back in' to feel stable again.  She engages in strength training and has not stopped this activity. She has not been taking any specific medication for this issue prior to the visit.  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         OBJECTIVE:       07/12/2024   10:07 AM  Depression screen PHQ 2/9  Decreased Interest 0  Down, Depressed, Hopeless 0  PHQ - 2 Score 0  Altered sleeping 3  Tired, decreased energy 3   Change in appetite 3  Feeling bad or failure about yourself  0  Trouble concentrating 3  Moving slowly or fidgety/restless 0  Suicidal thoughts 0  PHQ-9 Score 12  Difficult doing work/chores Somewhat difficult     BP Readings from Last 3 Encounters:  07/25/24 128/80  07/12/24 122/78  07/09/23 118/72    BP 128/80   Pulse 78   Wt 179 lb 6.4 oz (81.4 kg)   LMP 06/26/2024   SpO2 98%   BMI 29.40 kg/m    Physical Exam   MUSCULOSKELETAL: Pain in right hip with movement, described as pinching. Mild discomfort in right hip with static position, not severe. No pain with resisted abduction of right hip. Pain with Fadir, No pain with Deri     Physical Exam Constitutional:      Appearance: Normal appearance.  Neurological:     General: No focal deficit present.     Mental Status: She is alert and oriented to person, place, and time. Mental status is at baseline.     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 X-rays normal.  MRI needed for definitive diagnosis. Discussed labral tear and potential surgical intervention if conservative measures fail. - Ordered MRI of the right hip to evaluate for labral tear. - Prescribed meloxicam  for pain management during flare-ups, one daily, avoid concurrent ibuprofen . - Continue strength training and physical therapy as tolerated. - Will discuss potential surgical intervention if conservative measures fail and symptoms persist.  Low back pain Intermittent low back pain likely secondary to compensatory mechanisms from hip instability. - Continue to monitor low back pain in conjunction with hip treatment.         Altheria Shadoan A. Vita MD [facility] "

## 2024-08-03 ENCOUNTER — Telehealth: Payer: Self-pay

## 2024-08-03 NOTE — Telephone Encounter (Signed)
 Copied from CRM [id number]. Topic: General - Other >> Aug 03, 2024 11:41 AM Treva T wrote: Reason for CRM: Pt calling requesting a copy of immunization records.   Pt requesting immunizations be submitted via mychart, via email.  Email:  [email or web address]  Pt reports, is unable to pick up records in person.   Pt can be reached if need to discuss further at [phone].

## 2024-08-05 ENCOUNTER — Encounter: Payer: Self-pay | Admitting: Family Medicine

## 2024-08-14 ENCOUNTER — Other Ambulatory Visit

## 2024-09-18 ENCOUNTER — Other Ambulatory Visit

## 2025-01-09 ENCOUNTER — Ambulatory Visit: Admitting: Nurse Practitioner
